# Patient Record
Sex: Female | Born: 1950 | Race: White | Hispanic: No | Marital: Married | Smoking: Former smoker
Health system: Southern US, Community
[De-identification: ages and names within clinical notes are randomized; demographics above are authoritative.]

## PROBLEM LIST (undated history)

## (undated) DIAGNOSIS — IMO0001 Reserved for inherently not codable concepts without codable children: Secondary | ICD-10-CM

## (undated) DIAGNOSIS — R7303 Prediabetes: Secondary | ICD-10-CM

## (undated) DIAGNOSIS — F329 Major depressive disorder, single episode, unspecified: Secondary | ICD-10-CM

## (undated) DIAGNOSIS — F32A Depression, unspecified: Secondary | ICD-10-CM

## (undated) HISTORY — DX: Major depressive disorder, single episode, unspecified: F32.9

## (undated) HISTORY — DX: Reserved for inherently not codable concepts without codable children: IMO0001

## (undated) HISTORY — PX: OTHER SURGICAL HISTORY: SHX169

## (undated) HISTORY — DX: Depression, unspecified: F32.A

## (undated) HISTORY — DX: Prediabetes: R73.03

---
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
## 2006-10-09 ENCOUNTER — Ambulatory Visit: Payer: Self-pay | Admitting: Obstetrics and Gynecology

## 2006-10-18 ENCOUNTER — Ambulatory Visit: Payer: Self-pay | Admitting: Obstetrics and Gynecology

## 2007-05-06 ENCOUNTER — Ambulatory Visit: Payer: Self-pay

## 2007-10-25 ENCOUNTER — Ambulatory Visit: Payer: Self-pay

## 2007-10-31 ENCOUNTER — Ambulatory Visit: Payer: Self-pay

## 2009-12-10 ENCOUNTER — Ambulatory Visit: Payer: Self-pay

## 2010-11-20 ENCOUNTER — Emergency Department: Payer: Self-pay | Admitting: Emergency Medicine

## 2010-12-01 ENCOUNTER — Ambulatory Visit: Payer: Self-pay | Admitting: Orthopedic Surgery

## 2010-12-02 ENCOUNTER — Ambulatory Visit: Payer: Self-pay | Admitting: Orthopedic Surgery

## 2011-03-27 ENCOUNTER — Ambulatory Visit: Payer: Self-pay | Admitting: Family Medicine

## 2012-07-30 IMAGING — CR DG HUMERUS 2V *L*
1 series · 1 of 1 positions shown · non-contrast
Comparison: none

REASON FOR EXAM: fall/pain..pt in [facility]
COMMENTS:   May transport without cardiac monitor

PROCEDURE:     DXR - DXR HUMERUS LEFT  - November 20, 2010  [DATE]
RESULT:     Comparison: None.

[view not recorded]
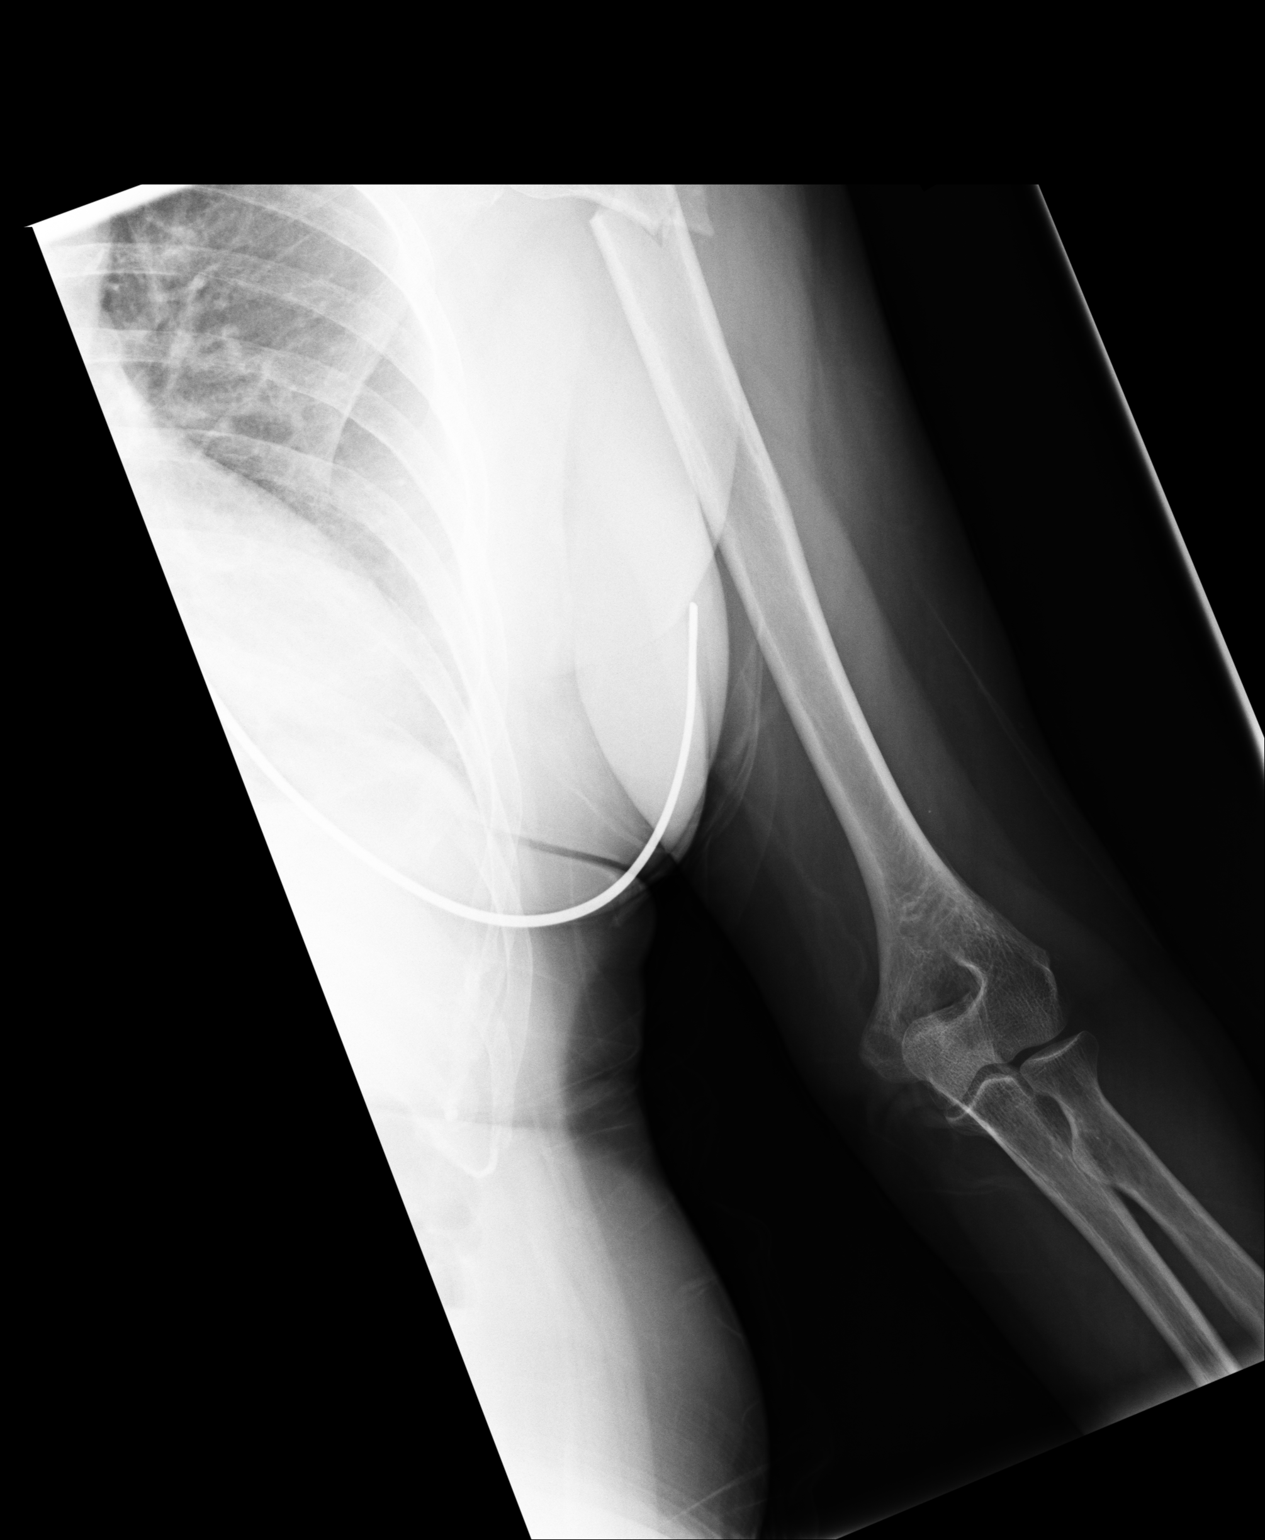

[1 of 1 positions shown; findings below may reference images not displayed]

FINDINGS: Evaluation limited by single view. There is a comminuted, displaced fracture
the proximal humeral metadiaphysis. This is better visualized on the
dedicated shoulder radiographs.
IMPRESSION: Proximal humeral fracture.

## 2012-08-11 IMAGING — CR DG SHOULDER 3+V*L*
1 series · 3 of 3 positions shown · non-contrast
Comparison: none

REASON FOR EXAM: post-op.
COMMENTS:   Bedside (portable):Y

[Series 1: view not recorded · 0.17mm/px · 3 of 3 slices shown]
[im 1/3]
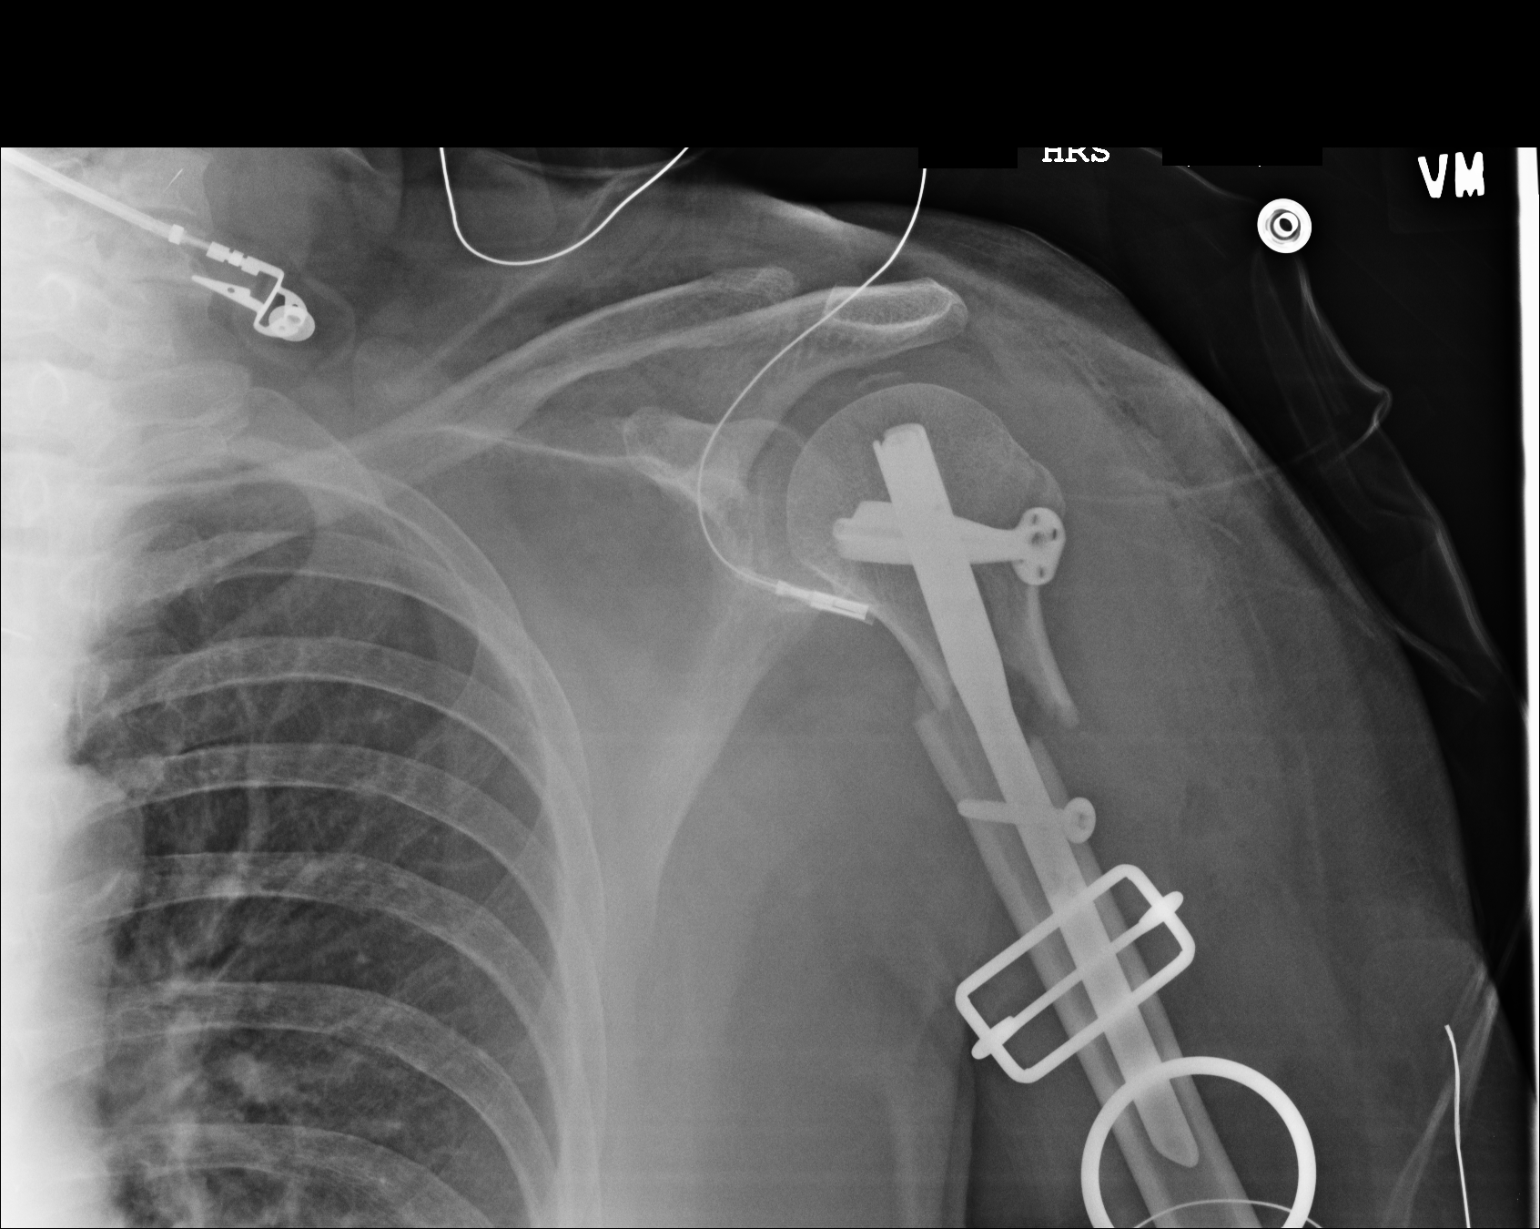
[im 2/3]
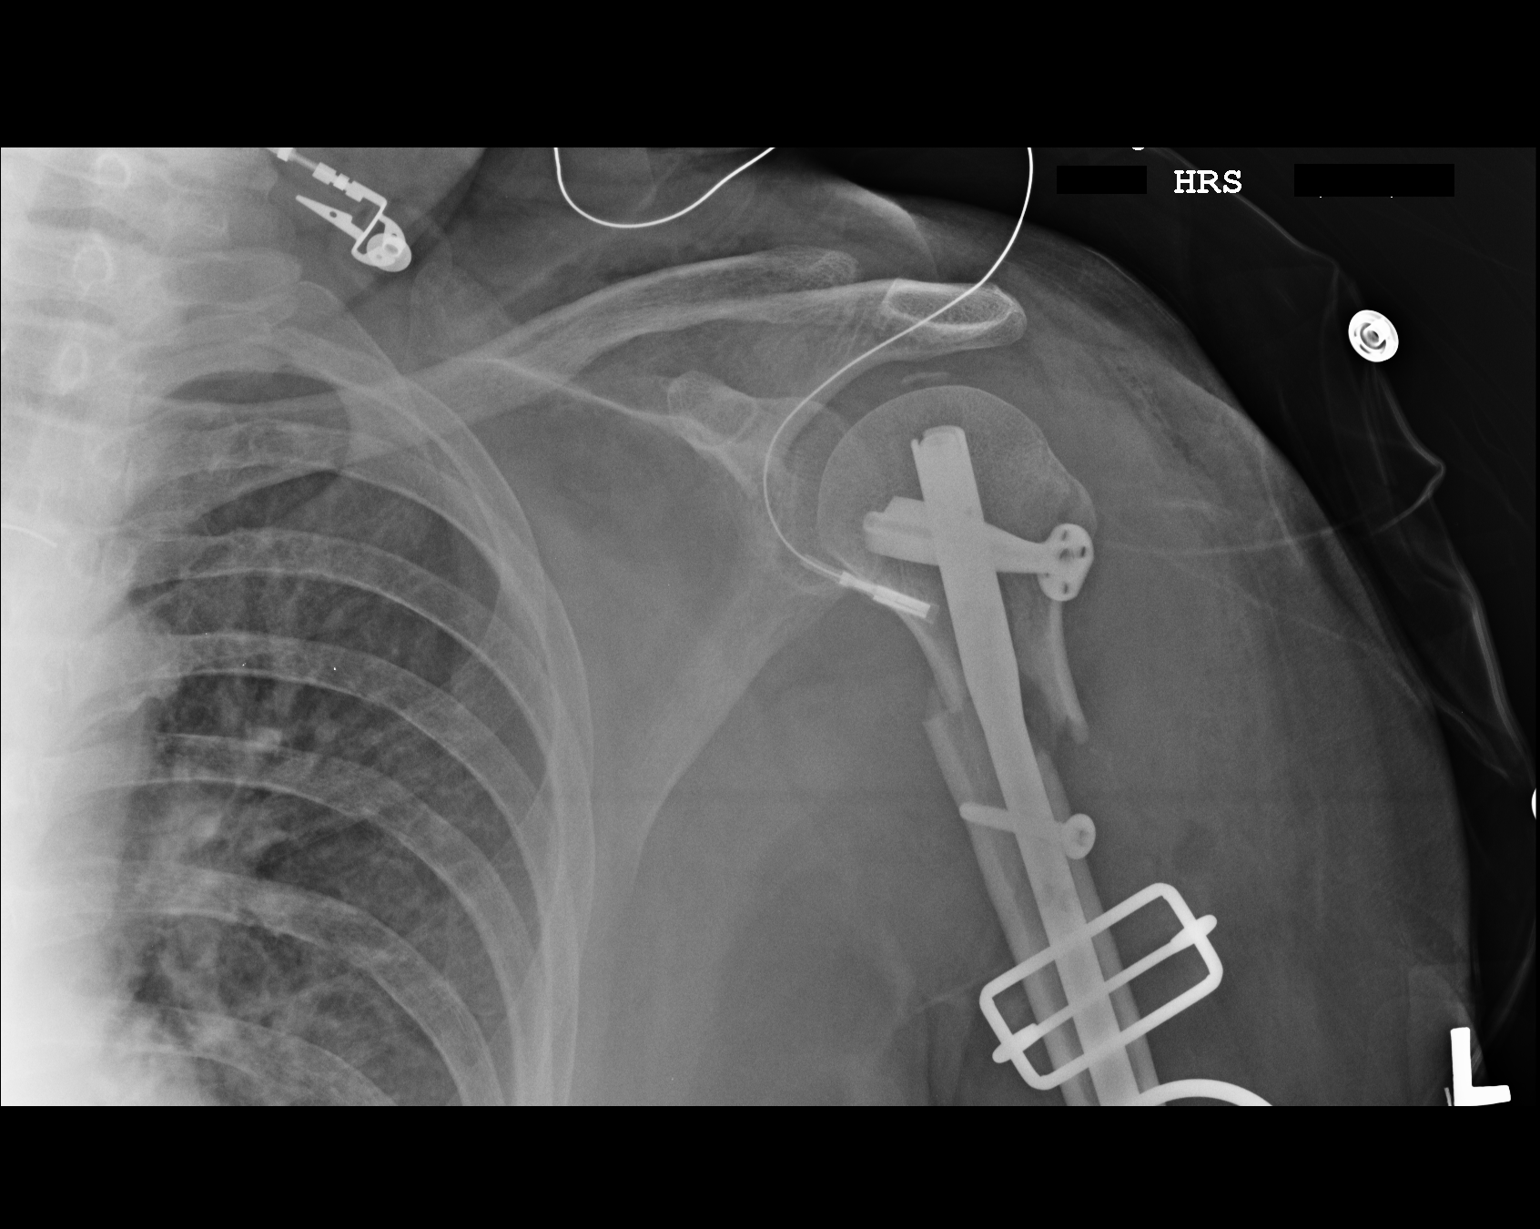
[im 3/3]
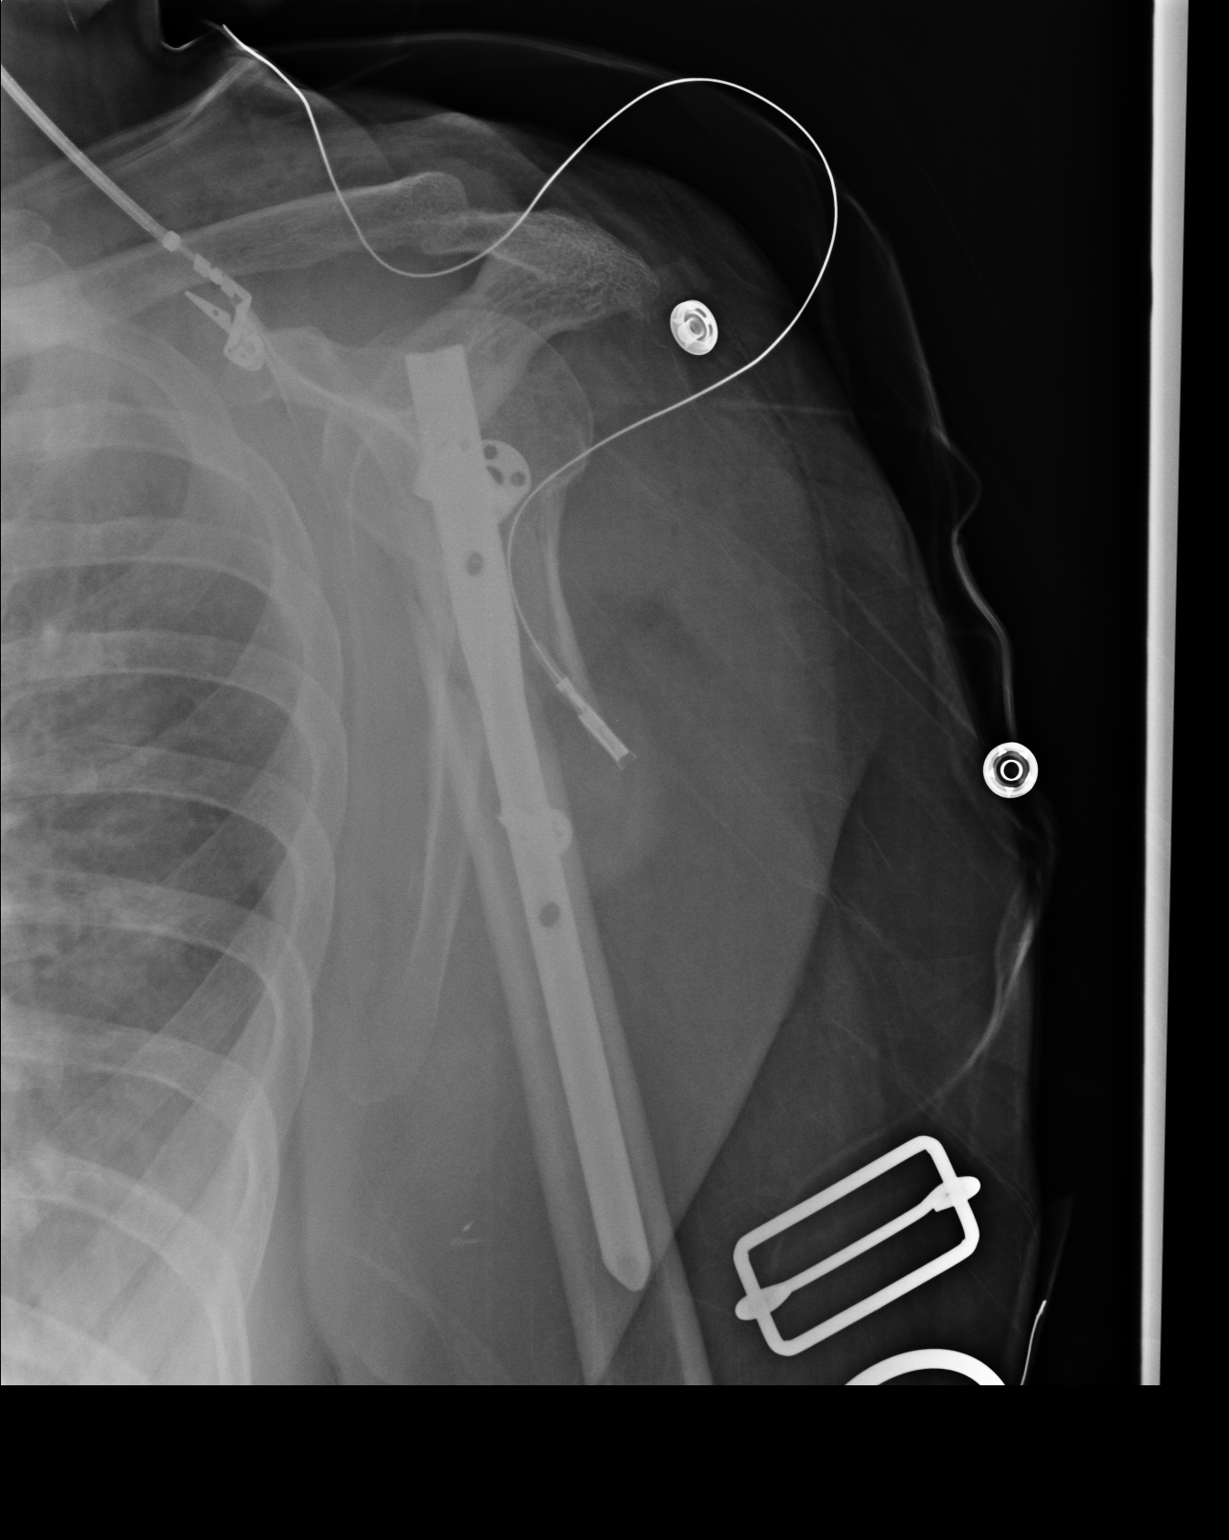

[3 of 3 positions shown; findings below may reference images not displayed]

PROCEDURE:     DXR - DXR SHOULDER LEFT COMPLETE  - December 02, 2010  [DATE]

RESULT:     Comparison is made to the previous study of 11/20/2010. An
intramedullary rod has been placed across the proximal left humeral
fracture. Fixation screws are present. Is no immediate postoperative bone or
hardware complication evident.
IMPRESSION: Left proximal humeral intramedullary rod traversing the
fracture into the mid humeral level.

## 2013-03-13 ENCOUNTER — Ambulatory Visit: Payer: Self-pay | Admitting: Family Medicine

## 2014-05-15 ENCOUNTER — Ambulatory Visit: Payer: Self-pay | Admitting: Family Medicine

## 2016-02-18 ENCOUNTER — Encounter: Payer: Self-pay | Admitting: Obstetrics and Gynecology

## 2016-02-21 ENCOUNTER — Encounter: Payer: Self-pay | Admitting: *Deleted

## 2016-02-25 ENCOUNTER — Encounter: Payer: Self-pay | Admitting: Obstetrics and Gynecology

## 2016-02-25 ENCOUNTER — Ambulatory Visit (INDEPENDENT_AMBULATORY_CARE_PROVIDER_SITE_OTHER): Payer: BLUE CROSS/BLUE SHIELD | Admitting: Obstetrics and Gynecology

## 2016-02-25 VITALS — BP 186/110 | HR 112 | Ht 65.5 in | Wt 164.0 lb

## 2016-02-25 DIAGNOSIS — E663 Overweight: Secondary | ICD-10-CM | POA: Diagnosis not present

## 2016-02-25 DIAGNOSIS — Z01419 Encounter for gynecological examination (general) (routine) without abnormal findings: Secondary | ICD-10-CM

## 2016-02-25 DIAGNOSIS — F419 Anxiety disorder, unspecified: Secondary | ICD-10-CM | POA: Diagnosis not present

## 2016-02-25 MED ORDER — HYDROCHLOROTHIAZIDE 25 MG PO TABS: 25.0000 mg | ORAL_TABLET | Freq: Every day | ORAL | Status: DC

## 2016-02-25 MED ORDER — FLUOXETINE HCL 10 MG PO CAPS: 10.0000 mg | ORAL_CAPSULE | Freq: Every day | ORAL | Status: DC

## 2016-02-25 NOTE — Progress Notes (Signed)
Subjective:   Sylvia CollumSherry N Mitchell is a 65 y.o. G1P0 Caucasian female here for a routine well-woman exam.  No LMP recorded. Patient is postmenopausal.    Current complaints: anxiety not better- may want to try meds PCP: me       does desire labs  Social History: Sexual: heterosexual Marital Status: married Living situation: with spouse Occupation: unknown occupation Tobacco/alcohol: no tobacco use Illicit drugs: no history of illicit drug use  The following portions of the patient's history were reviewed and updated as appropriate: allergies, current medications, past family history, past medical history, past social history, past surgical history and problem list.  Past Medical History Past Medical History  Diagnosis Date  . White coat hypertension   . Borderline diabetes     Past Surgical History Past Surgical History  Procedure Laterality Date  . Tonsillectomy  1962    Gynecologic History G1P0  No LMP recorded. Patient is postmenopausal. Contraception: post menopausal status Last Pap: 2015. Results were: normal Last mammogram: 2016. Results were: normal   Obstetric History OB History  Gravida Para Term Preterm AB SAB TAB Ectopic Multiple Living  1         1    # Outcome Date GA Lbr Len/2nd Weight Sex Delivery Anes PTL Lv  1 Gravida 1978    F Vag-Spont   Y      Current Medications No current outpatient prescriptions on file prior to visit.   No current facility-administered medications on file prior to visit.    Review of Systems Patient denies any headaches, blurred vision, shortness of breath, chest pain, abdominal pain, problems with bowel movements, urination, or intercourse.  Objective:  BP 186/110 mmHg  Pulse 112  Ht 5' 5.5" (1.664 m)  Wt 164 lb (74.39 kg)  BMI 26.87 kg/m2 Physical Exam  General:  Well developed, well nourished, no acute distress. She is alert and oriented x3. Skin:  Warm and dry Neck:  Midline trachea, no thyromegaly or  nodules Cardiovascular: Regular rate and rhythm, no murmur heard Lungs:  Effort normal, all lung fields clear to auscultation bilaterally Breasts:  No dominant palpable mass, retraction, or nipple discharge Abdomen:  Soft, non tender, no hepatosplenomegaly or masses Pelvic:  External genitalia is normal in appearance.  The vagina is normal in appearance. The cervix is bulbous, no CMT.  Thin prep pap is not done . Uterus is felt to be normal size, shape, and contour.  No adnexal masses or tenderness noted. Rectal: not active hemorrhoids Extremities:  No swelling or varicosities noted Psych:  She has a normal mood and affect  Assessment:   Healthy well-woman exam Hypertension Anxiety with mild depression  Plan:  Labs obtained prozac prescribed-counseled on side-effects, treatement and expected outcome/ HCTZ refilled F/U 1 year for AE, or sooner if needed Mammogram scheduled  Charmine Bockrath Sylvia Mitchell, CNM

## 2016-02-25 NOTE — Patient Instructions (Addendum)
Place annual gynecologic exam patient instructions here.  Thank you for enrolling in MyChart. Please follow the instructions below to securely access your online medical record. MyChart allows you to send messages to your doctor, view your test results, manage appointments, and more.   How Do I Sign Up? 1. In your Internet browser, go to [employer] and enter [email or web address]. 2. Click on the Sign Up Now link in the Sign In box. You will see the New Member Sign Up page. 3. Enter your MyChart Access Code exactly as it appears below. You will not need to use this code after you've completed the sign-up process. If you do not sign up before the expiration date, you must request a new code.  MyChart Access Code: [email or web address] Expires: 03/20/2016 10:40 AM  4. Enter your Social Security Number ([id number]) and Date of Birth (mm/dd/yyyy) as indicated and click Submit. You will be taken to the next sign-up page. 5. Create a MyChart ID. This will be your MyChart login ID and cannot be changed, so think of one that is secure and easy to remember. 6. Create a MyChart password. You can change your password at any time. 7. Enter your Password Reset Question and Answer. This can be used at a later time if you forget your password.  8. Enter your e-mail address. You will receive e-mail notification when new information is available in MyChart. 9. Click Sign Up. You can now view your medical record.   Additional Information Remember, MyChart is NOT to be used for urgent needs. For medical emergencies, dial 911.   Generalized Anxiety Disorder Generalized anxiety disorder (GAD) is a mental disorder. It interferes with life functions, including relationships, work, and school. GAD is different from normal anxiety, which everyone experiences at some point in their lives in response to specific life events and activities. Normal anxiety actually helps [place] prepare for and get through these  life events and activities. Normal anxiety goes away after the event or activity is over.  GAD causes anxiety that is not necessarily related to specific events or activities. It also causes excess anxiety in proportion to specific events or activities. The anxiety associated with GAD is also difficult to control. GAD can vary from mild to severe. People with severe GAD can have intense waves of anxiety with physical symptoms (panic attacks).  SYMPTOMS The anxiety and worry associated with GAD are difficult to control. This anxiety and worry are related to many life events and activities and also occur more days than not for 6 months or longer. People with GAD also have three or more of the following symptoms (one or more in children):  Restlessness.   Fatigue.  Difficulty concentrating.   Irritability.  Muscle tension.  Difficulty sleeping or unsatisfying sleep. DIAGNOSIS GAD is diagnosed through an assessment by your health care provider. Your health care provider will ask you questions aboutyour mood,physical symptoms, and events in your life. Your health care provider may ask you about your medical history and use of alcohol or drugs, including prescription medicines. Your health care provider may also do a physical exam and blood tests. Certain medical conditions and the use of certain substances can cause symptoms similar to those associated with GAD. Your health care provider may refer you to a mental health specialist for further evaluation. TREATMENT The following therapies are usually used to treat GAD:   Medication. Antidepressant medication usually is prescribed for long-term daily control. Antianxiety medicines may be added in severe cases,  especially when panic attacks occur.   Talk therapy (psychotherapy). Certain types of talk therapy can be helpful in treating GAD by providing support, education, and guidance. A form of talk therapy called cognitive behavioral therapy can  teach you healthy ways to think about and react to daily life events and activities.  Stress managementtechniques. These include yoga, meditation, and exercise and can be very helpful when they are practiced regularly. A mental health specialist can help determine which treatment is best for you. Some people see improvement with one therapy. However, other people require a combination of therapies.   This information is not intended to replace advice given to you by your health care provider. Make sure you discuss any questions you have with your health care provider.   Document Released: 03/03/2013 Document Revised: 11/27/2014 Document Reviewed: 03/03/2013 Elsevier Interactive Patient Education [employer]2016 Elsevier Inc.

## 2016-02-26 LAB — COMPREHENSIVE METABOLIC PANEL
ALBUMIN: 4.5 g/dL (ref 3.6–4.8)
ALK PHOS: 102 IU/L (ref 39–117)
ALT: 26 IU/L (ref 0–32)
AST: 29 IU/L (ref 0–40)
Albumin/Globulin Ratio: 2 (ref 1.2–2.2)
BILIRUBIN TOTAL: 0.5 mg/dL (ref 0.0–1.2)
BUN / CREAT RATIO: 21 (ref 12–28)
BUN: 13 mg/dL (ref 8–27)
CHLORIDE: 88 mmol/L — AB (ref 96–106)
CO2: 22 mmol/L (ref 18–29)
CREATININE: 0.63 mg/dL (ref 0.57–1.00)
Calcium: 9.4 mg/dL (ref 8.7–10.3)
GFR calc Af Amer: 111 mL/min/{1.73_m2} (ref >59)
GFR calc non Af Amer: 96 mL/min/{1.73_m2} (ref >59)
GLUCOSE: 108 mg/dL — AB (ref 65–99)
Globulin, Total: 2.3 g/dL (ref 1.5–4.5)
Potassium: 4.2 mmol/L (ref 3.5–5.2)
Sodium: 130 mmol/L — ABNORMAL LOW (ref 134–144)
Total Protein: 6.8 g/dL (ref 6.0–8.5)

## 2016-02-26 LAB — TSH: TSH: 2.07 u[IU]/mL (ref 0.450–4.500)

## 2016-02-26 LAB — LIPID PANEL
CHOL/HDL RATIO: 2.5 ratio (ref 0.0–4.4)
CHOLESTEROL TOTAL: 254 mg/dL — AB (ref 100–199)
HDL: 103 mg/dL (ref >39)
LDL CALC: 138 mg/dL — AB (ref 0–99)
TRIGLYCERIDES: 66 mg/dL (ref 0–149)
VLDL CHOLESTEROL CAL: 13 mg/dL (ref 5–40)

## 2016-02-28 ENCOUNTER — Telehealth: Payer: Self-pay | Admitting: *Deleted

## 2016-02-28 NOTE — Telephone Encounter (Signed)
Mailed pt all info regarding labs

## 2016-02-28 NOTE — Telephone Encounter (Signed)
-----   Message from Purcell NailsMelody N Shambley, [place] sent at 02/26/2016  5:11 PM EDT ----- Please let her know results, needs to keep working on low carb, low cholesterol diet, regular exercise and weight loss. May want to add a baby aspirin daily if she isn't already taking one, also can try Cinnamon capsules to help lower sugar and cholesterol. We will recheck levels next year.

## 2016-03-27 ENCOUNTER — Telehealth: Payer: Self-pay | Admitting: *Deleted

## 2016-03-27 NOTE — Telephone Encounter (Signed)
Patient called and states that her insurance wont cover for the RX Prozac because of the current dosage of 10 mg twice daily and having 60 capsules. The insurance company will cover 20mg  once a day with 30 capsules.. Patient is wanting to know if Melody will write a new RX with the new dosage on there so her insurance can cover it. [place]  [place] [facility] [facility]. Thanks

## 2016-03-28 ENCOUNTER — Other Ambulatory Visit: Payer: Self-pay | Admitting: *Deleted

## 2016-03-28 MED ORDER — FLUOXETINE HCL 20 MG PO CAPS: 20.0000 mg | ORAL_CAPSULE | Freq: Every day | ORAL | Status: DC

## 2016-03-28 NOTE — Telephone Encounter (Signed)
Done-ac 

## 2017-02-06 ENCOUNTER — Telehealth: Payer: Self-pay | Admitting: Obstetrics and Gynecology

## 2017-02-06 NOTE — Telephone Encounter (Signed)
I recommend saline nasal spray twice a day and sudafed as needed. If that doesn't help in 3-4 days she would need to be seen

## 2017-02-06 NOTE — Telephone Encounter (Signed)
Notified pt. 

## 2017-02-06 NOTE — Telephone Encounter (Signed)
Patient called complaining of a sinus infection. She states that she no longer has a fever but she states that, "all kinds of colored stuff" is coming out of her nose. She states that it is getting better, but she needs something to help clear it up. [facility] [place]. Please advise.

## 2017-02-06 NOTE — Telephone Encounter (Signed)
pls advise

## 2017-02-23 ENCOUNTER — Encounter: Payer: BLUE CROSS/BLUE SHIELD | Admitting: Obstetrics and Gynecology

## 2017-02-23 ENCOUNTER — Encounter: Payer: Self-pay | Admitting: Certified Nurse Midwife

## 2017-02-23 ENCOUNTER — Ambulatory Visit (INDEPENDENT_AMBULATORY_CARE_PROVIDER_SITE_OTHER): Payer: BLUE CROSS/BLUE SHIELD | Admitting: Certified Nurse Midwife

## 2017-02-23 VITALS — BP 174/83 | HR 69 | Ht 66.0 in | Wt 180.1 lb

## 2017-02-23 DIAGNOSIS — Z1231 Encounter for screening mammogram for malignant neoplasm of breast: Secondary | ICD-10-CM | POA: Diagnosis not present

## 2017-02-23 DIAGNOSIS — R638 Other symptoms and signs concerning food and fluid intake: Secondary | ICD-10-CM | POA: Diagnosis not present

## 2017-02-23 DIAGNOSIS — Z01419 Encounter for gynecological examination (general) (routine) without abnormal findings: Secondary | ICD-10-CM | POA: Diagnosis not present

## 2017-02-23 DIAGNOSIS — Z1239 Encounter for other screening for malignant neoplasm of breast: Secondary | ICD-10-CM

## 2017-02-23 MED ORDER — FLUOXETINE HCL 20 MG PO CAPS: 20.0000 mg | ORAL_CAPSULE | Freq: Every day | ORAL | 11 refills | Status: DC

## 2017-02-23 MED ORDER — HYDROCHLOROTHIAZIDE 25 MG PO TABS: 25.0000 mg | ORAL_TABLET | Freq: Every day | ORAL | 4 refills | Status: DC

## 2017-02-23 NOTE — Addendum Note (Signed)
Addended by: Marchelle Folks on: 02/23/2017 02:43 PM   Modules accepted: Orders

## 2017-02-23 NOTE — Progress Notes (Signed)
ANNUAL PREVENTATIVE CARE GYN  ENCOUNTER NOTE  Subjective:       Sylvia Mitchell is a 66 y.o. G1P0 female here for a routine annual gynecologic exam.  No current complaints.   Draya desires annual labs and medication refills.   She has provided a home blood pressure log for review due to her white coat hypertension.  Denies difficulty breathing or respiratory distress, chest pain, abdominal pain, vaginal bleeding, and leg pain or swelling.   She continues to work at [employer] and has been there for the last 35 years.    Gynecologic History  No LMP recorded. Patient is postmenopausal.   Contraception: post menopausal status   Last Pap: 2015 wnl. Results were: normal  Last mammogram: 04/2014 birad 1. Results were: normal  Obstetric History OB History  Gravida Para Term Preterm AB Living  1         1  SAB TAB Ectopic Multiple Live Births          1    # Outcome Date GA Lbr Len/2nd Weight Sex Delivery Anes PTL Lv  1 Gravida 1978    F Vag-Spont   LIV      Past Medical History:  Diagnosis Date  . Borderline diabetes   . White coat hypertension     Past Surgical History:  Procedure Laterality Date  . TONSILLECTOMY  1962    Current Outpatient Prescriptions on File Prior to Visit  Medication Sig Dispense Refill  . FLUoxetine (PROZAC) 20 MG capsule Take 1 capsule (20 mg total) by mouth daily. 30 capsule 11  . hydrochlorothiazide (HYDRODIURIL) 25 MG tablet Take 1 tablet (25 mg total) by mouth daily. 90 tablet 4   No current facility-administered medications on file prior to visit.     Allergies  Allergen Reactions  . Penicillins     Social History   Social History  . Marital status: Married    Spouse name: N/A  . Number of children: N/A  . Years of education: N/A   Occupational History  . Not on file.   Social History Main Topics  . Smoking status: Former Games developer  . Smokeless tobacco: Never Used  . Alcohol use Yes     Comment: occas  . Drug use: No  .  Sexual activity: Yes   Other Topics Concern  . Not on file   Social History Narrative  . No narrative on file    No family history on file.  The following portions of the patient's history were reviewed and updated as appropriate: allergies, current medications, past family history, past medical history, past social history, past surgical history and problem list.  Review of Systems  ROS negative except as noted above. Information obtained from patient.    Objective:   BP (!) 174/83   Pulse 69   Ht  (1.676 m)   Wt 180 lb 1.6 oz (81.7 kg)   BMI 29.07 kg/m    CONSTITUTIONAL: Well-developed, well-nourished female in no acute distress.   PSYCHIATRIC: Normal mood and affect. Normal behavior. Normal judgment and thought content.  NEUROLGIC: Alert and oriented to person, place, and time. Normal muscle tone coordination. No cranial nerve deficit noted.  HENT:  Normocephalic, atraumatic, External right and left ear normal. Oropharynx is clear and moist.  EYES: Conjunctivae and EOM are normal. Pupils are equal, round, and reactive to light. No scleral icterus.   NECK: Normal range of motion, supple, no masses.  Normal thyroid.   SKIN: Skin  is warm and dry. No rash noted. Not diaphoretic. No erythema. No pallor.  CARDIOVASCULAR: Normal heart rate noted, regular rhythm, no murmur.  RESPIRATORY: Clear to auscultation bilaterally. Effort and breath sounds normal, no problems with respiration noted.  BREASTS: Symmetric in size. No masses, skin changes, nipple drainage, or lymphadenopathy.  ABDOMEN: Soft, normal bowel sounds, no distention noted.  No tenderness, rebound or guarding.   PELVIC:  External Genitalia: Normal  Vagina: Normal  Cervix: Normal  Uterus: Normal  Adnexa: Normal  MUSCULOSKELETAL: Normal range of motion. No tenderness.  No cyanosis, clubbing, or edema.  2+ distal pulses.  LYMPHATIC: No Axillary, Supraclavicular, or Inguinal Adenopathy.  Assessment:    Annual gynecologic examination 66 y.o.   Contraception: post menopausal status   [id number] Problem List Items Addressed This Visit    None      Plan:   Pap: Pap Co Test  Mammogram: Ordered  Stool Guaiac Testing:  decline stool cards and never had a colonoscopy  Labs: cmp,lipid,tsh,vit d a1c   Blood pressure log reviewed WNL  Medications Rx, see orders  Routine preventative health maintenance measures emphasized: Exercise/Diet/Weight control, Alcohol/Substance use risks and Stress Management  Return to Clinic - 1 Year for annual exam or sooner if needed   Gunnar Bulla, CNM

## 2017-02-23 NOTE — Patient Instructions (Signed)

## 2017-02-24 LAB — LIPID PANEL
CHOL/HDL RATIO: 3.7 ratio (ref 0.0–4.4)
Cholesterol, Total: 282 mg/dL — ABNORMAL HIGH (ref 100–199)
HDL: 77 mg/dL (ref >39)
LDL CALC: 187 mg/dL — AB (ref 0–99)
Triglycerides: 92 mg/dL (ref 0–149)
VLDL CHOLESTEROL CAL: 18 mg/dL (ref 5–40)

## 2017-02-24 LAB — COMPREHENSIVE METABOLIC PANEL
A/G RATIO: 1.8 (ref 1.2–2.2)
ALBUMIN: 4.3 g/dL (ref 3.6–4.8)
ALT: 43 IU/L — ABNORMAL HIGH (ref 0–32)
AST: 34 IU/L (ref 0–40)
Alkaline Phosphatase: 111 IU/L (ref 39–117)
BILIRUBIN TOTAL: 0.3 mg/dL (ref 0.0–1.2)
BUN / CREAT RATIO: 34 — AB (ref 12–28)
BUN: 23 mg/dL (ref 8–27)
CHLORIDE: 98 mmol/L (ref 96–106)
CO2: 21 mmol/L (ref 18–29)
Calcium: 9.5 mg/dL (ref 8.7–10.3)
Creatinine, Ser: 0.68 mg/dL (ref 0.57–1.00)
GFR calc non Af Amer: 93 mL/min/{1.73_m2} (ref >59)
GFR, EST AFRICAN AMERICAN: 108 mL/min/{1.73_m2} (ref >59)
GLOBULIN, TOTAL: 2.4 g/dL (ref 1.5–4.5)
GLUCOSE: 126 mg/dL — AB (ref 65–99)
Potassium: 4.2 mmol/L (ref 3.5–5.2)
SODIUM: 138 mmol/L (ref 134–144)
TOTAL PROTEIN: 6.7 g/dL (ref 6.0–8.5)

## 2017-02-24 LAB — HEMOGLOBIN A1C
Est. average glucose Bld gHb Est-mCnc: 117 mg/dL
Hgb A1c MFr Bld: 5.7 % — ABNORMAL HIGH (ref 4.8–5.6)

## 2017-02-24 LAB — VITAMIN D 25 HYDROXY (VIT D DEFICIENCY, FRACTURES): VIT D 25 HYDROXY: 31.7 ng/mL (ref 30.0–100.0)

## 2017-02-24 LAB — TSH: TSH: 2.03 u[IU]/mL (ref 0.450–4.500)

## 2017-02-27 LAB — PAP IG AND HPV HIGH-RISK
HPV, HIGH-RISK: NEGATIVE
PAP Smear Comment: 0

## 2017-10-19 ENCOUNTER — Ambulatory Visit: Payer: BLUE CROSS/BLUE SHIELD | Admitting: Obstetrics and Gynecology

## 2017-10-19 ENCOUNTER — Encounter: Payer: Self-pay | Admitting: Obstetrics and Gynecology

## 2017-10-19 VITALS — BP 180/98 | HR 88 | Ht 65.5 in | Wt 175.6 lb

## 2017-10-19 DIAGNOSIS — E785 Hyperlipidemia, unspecified: Secondary | ICD-10-CM

## 2017-10-19 DIAGNOSIS — R030 Elevated blood-pressure reading, without diagnosis of hypertension: Secondary | ICD-10-CM

## 2017-10-19 MED ORDER — ALPRAZOLAM 0.5 MG PO TABS: 0.5000 mg | ORAL_TABLET | Freq: Every evening | ORAL | 2 refills | Status: DC | PRN

## 2017-10-19 NOTE — Progress Notes (Signed)
Subjective:     Patient ID: Sylvia Mitchell, female   DOB: 01-17-1951, 66 y.o.   MRN: [id number]  HPI Sent here by podiatrist due to severely high BP reading while in his office (see care everywhere) has long standing h/o white coat syndrome and anxiety induced elevated blood pressure. Has been taking blood pressure readings at home and they are all 120-130/60-80 until heading in for an appointment or to work on days she know will be stressful.   Review of Systems Negative except stated above.    Objective:   Physical Exam A&Ox4 Well groomed female in no distress, although anxious Blood pressure (!) 180/98, pulse 88, height 5' 5.5" (1.664 m), weight 175 lb 9.6 oz (79.7 kg). HRR thyroid normal on exam     Assessment:     White coat syndrome hypertension Anxiety     Plan:     Will continue prozac and HCTZ at current doses. To continue monitoring BP at home. Labs obtained & will follow up accordingly Added xanax for prn use and pre-doctor visit use.  Melody Shambley,CNM

## 2017-10-20 LAB — COMPREHENSIVE METABOLIC PANEL
A/G RATIO: 1.7 (ref 1.2–2.2)
ALT: 31 IU/L (ref 0–32)
AST: 25 IU/L (ref 0–40)
Albumin: 4.7 g/dL (ref 3.6–4.8)
Alkaline Phosphatase: 107 IU/L (ref 39–117)
BILIRUBIN TOTAL: 0.4 mg/dL (ref 0.0–1.2)
BUN / CREAT RATIO: 32 — AB (ref 12–28)
BUN: 20 mg/dL (ref 8–27)
CALCIUM: 11.7 mg/dL — AB (ref 8.7–10.3)
CHLORIDE: 95 mmol/L — AB (ref 96–106)
CO2: 23 mmol/L (ref 20–29)
Creatinine, Ser: 0.63 mg/dL (ref 0.57–1.00)
GFR calc non Af Amer: 95 mL/min/{1.73_m2} (ref >59)
GFR, EST AFRICAN AMERICAN: 110 mL/min/{1.73_m2} (ref >59)
GLOBULIN, TOTAL: 2.7 g/dL (ref 1.5–4.5)
Glucose: 100 mg/dL — ABNORMAL HIGH (ref 65–99)
POTASSIUM: 3.7 mmol/L (ref 3.5–5.2)
Sodium: 138 mmol/L (ref 134–144)
Total Protein: 7.4 g/dL (ref 6.0–8.5)

## 2017-10-20 LAB — NMR, LIPOPROFILE
Cholesterol: 324 mg/dL — ABNORMAL HIGH (ref 100–199)
HDL Cholesterol by NMR: 111 mg/dL
HDL Particle Number: 59 umol/L
LDL Particle Number: 1676 nmol/L — ABNORMAL HIGH
LDL Size: 21.1 nm
LDL-C: 169 mg/dL — ABNORMAL HIGH (ref 0–99)
LP-IR Score: 27
Small LDL Particle Number: 515 nmol/L
Triglycerides by NMR: 222 mg/dL — ABNORMAL HIGH (ref 0–149)

## 2017-10-20 LAB — THYROID PANEL WITH TSH
FREE THYROXINE INDEX: 1.6 (ref 1.2–4.9)
T3 UPTAKE RATIO: 26 % (ref 24–39)
T4, Total: 6.3 ug/dL (ref 4.5–12.0)
TSH: 3.91 u[IU]/mL (ref 0.450–4.500)

## 2018-02-12 ENCOUNTER — Other Ambulatory Visit: Payer: Self-pay | Admitting: *Deleted

## 2018-02-12 ENCOUNTER — Telehealth: Payer: Self-pay | Admitting: Obstetrics and Gynecology

## 2018-02-12 MED ORDER — ALPRAZOLAM 0.5 MG PO TABS: 0.5000 mg | ORAL_TABLET | Freq: Every evening | ORAL | 2 refills | Status: AC | PRN

## 2018-02-12 NOTE — Telephone Encounter (Signed)
The patient called and stated that she needs Amy or Melody to check on her ALPRAZolam (XANAX) 0.5 MG tablet. No other information was disclosed. Please advise.

## 2018-02-13 NOTE — Telephone Encounter (Signed)
Faxed to pharmacy

## 2018-02-22 ENCOUNTER — Encounter: Payer: BLUE CROSS/BLUE SHIELD | Admitting: Obstetrics and Gynecology

## 2018-03-12 ENCOUNTER — Other Ambulatory Visit: Payer: Self-pay | Admitting: Certified Nurse Midwife

## 2018-03-13 ENCOUNTER — Other Ambulatory Visit: Payer: Self-pay

## 2018-03-13 MED ORDER — HYDROCHLOROTHIAZIDE 25 MG PO TABS: 25.0000 mg | ORAL_TABLET | Freq: Every day | ORAL | 1 refills | Status: DC

## 2018-05-03 ENCOUNTER — Encounter: Payer: BLUE CROSS/BLUE SHIELD | Admitting: Obstetrics and Gynecology

## 2018-05-07 ENCOUNTER — Other Ambulatory Visit: Payer: Self-pay | Admitting: Certified Nurse Midwife

## 2018-05-13 ENCOUNTER — Other Ambulatory Visit: Payer: Self-pay

## 2018-05-13 MED ORDER — FLUOXETINE HCL 20 MG PO CAPS: 20.0000 mg | ORAL_CAPSULE | Freq: Every day | ORAL | 0 refills | Status: AC

## 2018-06-25 ENCOUNTER — Telehealth: Payer: Self-pay

## 2018-06-25 NOTE — Telephone Encounter (Signed)
[facility] pt has not refilled fluoxitin 20 mg. Pts last appointment at [facility] was 10/19/17.

## 2018-10-08 ENCOUNTER — Encounter: Payer: Self-pay | Admitting: Obstetrics and Gynecology

## 2018-11-18 ENCOUNTER — Other Ambulatory Visit: Payer: Self-pay | Admitting: *Deleted

## 2018-11-18 ENCOUNTER — Telehealth: Payer: Self-pay | Admitting: Obstetrics and Gynecology

## 2018-11-18 MED ORDER — HYDROCHLOROTHIAZIDE 25 MG PO TABS: 25.0000 mg | ORAL_TABLET | Freq: Every day | ORAL | 1 refills | Status: AC

## 2018-11-18 NOTE — Telephone Encounter (Signed)
Done-ac 

## 2018-11-18 NOTE — Telephone Encounter (Signed)
Patient called requesting a refill on fluid pills. [facility] [place]. She is scheduled for her annual on 11/22/18

## 2018-11-22 ENCOUNTER — Encounter: Payer: Self-pay | Admitting: Obstetrics and Gynecology
